# Patient Record
Sex: Male | Born: 1975 | Hispanic: Yes | Marital: Married | Smoking: Never smoker
Health system: Southern US, Community
[De-identification: ages and names within clinical notes are randomized; demographics above are authoritative.]

## PROBLEM LIST (undated history)

## (undated) DIAGNOSIS — I10 Essential (primary) hypertension: Secondary | ICD-10-CM

## (undated) HISTORY — PX: EYE SURGERY: SHX253

---

## 2016-12-25 ENCOUNTER — Ambulatory Visit (INDEPENDENT_AMBULATORY_CARE_PROVIDER_SITE_OTHER): Payer: 59

## 2016-12-25 ENCOUNTER — Ambulatory Visit (HOSPITAL_COMMUNITY)
Admission: EM | Admit: 2016-12-25 | Discharge: 2016-12-25 | Disposition: A | Discharge status: Home / Self Care | Payer: 59 | Attending: Internal Medicine | Admitting: Internal Medicine

## 2016-12-25 ENCOUNTER — Encounter (HOSPITAL_COMMUNITY): Payer: Self-pay | Admitting: Emergency Medicine

## 2016-12-25 DIAGNOSIS — M722 Plantar fascial fibromatosis: Secondary | ICD-10-CM

## 2016-12-25 DIAGNOSIS — M79671 Pain in right foot: Secondary | ICD-10-CM | POA: Diagnosis not present

## 2016-12-25 DIAGNOSIS — S96911A Strain of unspecified muscle and tendon at ankle and foot level, right foot, initial encounter: Secondary | ICD-10-CM | POA: Diagnosis not present

## 2016-12-25 HISTORY — DX: Essential (primary) hypertension: I10

## 2016-12-25 NOTE — ED Provider Notes (Signed)
[facility]    CSN: [id number] Arrival date & time: 12/25/16  1001     History   Chief Complaint Chief Complaint  Patient presents with  . Foot Pain    HPI Joseph Carey is a 41 y.o. male.   2 days ago this 41 year old male well pain to the right foot primarily involving the plantar aspect and the lateral aspect with much tenderness and pain with weightbearing. He states the pain is much better with nonweightbearing, he is using crutches. At nighttime after working all day his foot does hurt when he goes to bed. Does not remember any known trauma. Has been getting worse over the past 2 days and his job which requires being on his feet for several hours during the day makes it worse particularly toward the afternoon.      Past Medical History:  Diagnosis Date  . Hypertension     There are no active problems to display for this patient.   Past Surgical History:  Procedure Laterality Date  . EYE SURGERY         Home Medications    Prior to Admission medications   Medication Sig Start Date End Date Taking? Authorizing Provider  ibuprofen (ADVIL,MOTRIN) 800 MG tablet Take 800 mg by mouth every 8 (eight) hours as needed.   Yes [provider]  OVER THE COUNTER MEDICATION Blood pressure medicine   Yes [provider]    Family History No family history on file.  Social History Social History  Substance Use Topics  . Smoking status: Never Smoker  . Smokeless tobacco: Not on file  . Alcohol use Yes     Allergies   Patient has no known allergies.   Review of Systems Review of Systems  Constitutional: Positive for activity change.  Respiratory: Negative.   Gastrointestinal: Negative.   Genitourinary: Negative.   Musculoskeletal: Positive for gait problem. Negative for joint swelling.       As per HPI  Neurological: Negative for dizziness, weakness, numbness and headaches.  All other systems reviewed and are  negative.    Physical Exam Triage Vital Signs ED Triage Vitals [12/25/16 1026]  Enc Vitals Group     BP      Pulse      Resp      Temp      Temp src      SpO2      Weight      Height      Head Circumference      Peak Flow      Pain Score 6     Pain Loc      Pain Edu?      Excl. in GC?    No data found.   Updated Vital Signs BP 120/84 (BP Location: Left Arm)   Pulse 64   Temp 97.9 F (36.6 C) (Oral)   Resp 18   SpO2 97%   Visual Acuity Right Eye Distance:   Left Eye Distance:   Bilateral Distance:    Right Eye Near:   Left Eye Near:    Bilateral Near:     Physical Exam  Constitutional: He is oriented to person, place, and time. He appears well-developed and well-nourished. No distress.  HENT:  Head: Normocephalic and atraumatic.  Eyes: EOM are normal. Left eye exhibits no discharge.  Neck: Normal range of motion. Neck supple.  Musculoskeletal: Normal range of motion. He exhibits tenderness. He exhibits no edema or deformity.  Foot  architecture appears to be intact. No swelling or deformity. No tenderness to the dorsal SPECT of the foot or ankle. Range of motion of the ankle is intact. No tenderness to the Achilles tendon or heel. The tenderness occurs primarily to the plantar aspect of the foot along the fifth metatarsal along the midline of the foot. No significant tenderness to the plantar aspect medial from the midline. No interruption in his can integrity. No signs of infection.  Neurological: He is alert and oriented to person, place, and time. No cranial nerve deficit.  Skin: Skin is warm and dry.  Psychiatric: He has a normal mood and affect.  Nursing note and vitals reviewed.    UC Treatments / Results  Labs (all labs ordered are listed, but only abnormal results are displayed) Labs Reviewed - No data to display  EKG  EKG Interpretation None       Radiology Dg Foot Complete Right  Result Date: 12/25/2016 CLINICAL DATA:  Pain EXAM: RIGHT  FOOT COMPLETE - 3+ VIEW COMPARISON:  None. FINDINGS: Frontal, oblique, and lateral views were obtained. No fracture or dislocation. 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 Other joint spaces appear normal. No erosive change. 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 Other joint spaces appear normal. No fracture or dislocation. Electronically Signed   By: Bretta BangWilliam  Woodruff III M.D.   On: 12/25/2016 11:02    Procedures Procedures (including critical care time)  Medications Ordered in UC Medications - No data to display   Initial Impression / Assessment and Plan / UC Course  I have reviewed the triage vital signs and the nursing notes.  Pertinent labs & imaging results that were available during my care of the patient were reviewed by me and considered in my medical decision making (see chart for details).     The pain in your foot is likely due to either a condition called plantar fasciitis where the membrane under the skin becomes inflamed and is worse with weightbearing. The other condition that may exist is the way you are walking in the work boot. It may be causing you to put more weight on the side of your foot causing strain of the muscles between the bones. Recommend wearing a full-length shoe insert with high arch support and rub the bottom of your foot over a very cold can or place it over ice to 3 times a day if possible to help with inflammation. Follow-up with the podiatrist if not getting better.   Final Clinical Impressions(s) / UC Diagnoses   Final diagnoses:  Plantar fasciitis of right foot  Right foot strain, initial encounter    New Prescriptions New Prescriptions   No medications on file      Controlled Substance Prescriptions    Hayden RasmussenMabe, Jerline Linzy, NP 12/25/16 1126

## 2016-12-25 NOTE — Discharge Instructions (Signed)
The pain in your foot is likely due to either a condition called plantar fasciitis where the membrane under the skin becomes inflamed and is worse with weightbearing. The other condition that may exist is the way you are walking in the work boot. It may be causing you to put more weight on the side of your foot causing strain of the muscles between the bones. Recommend wearing a full-length shoe insert with high arch support and rub the bottom of your foot over a very cold can or place it over ice to 3 times a day if possible to help with inflammation. Follow-up with the podiatrist if not getting better.

## 2016-12-25 NOTE — ED Triage Notes (Addendum)
Monday evening started having foot pain, right  Foot pain has increased since onset. Points to bottom, center of foot as location of pain, no known injury  Right pedal pulses 2+, able to move toes

## 2019-02-12 IMAGING — DX DG FOOT COMPLETE 3+V*R*
3 series · 3 of 3 positions shown · non-contrast
Comparison: None.

CLINICAL DATA: Pain

EXAM:
RIGHT FOOT COMPLETE - 3+ VIEW

[foot ap]
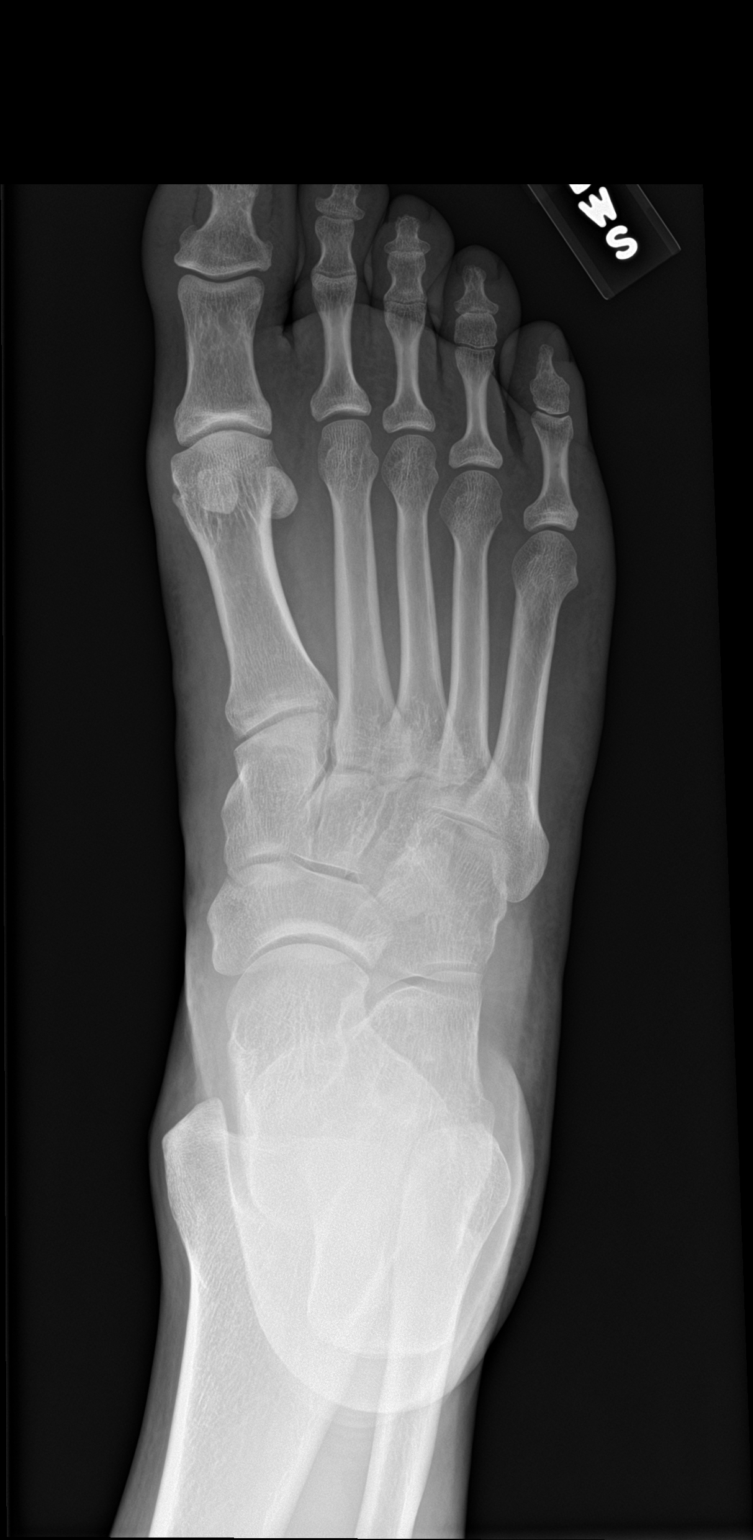

[foot obl]
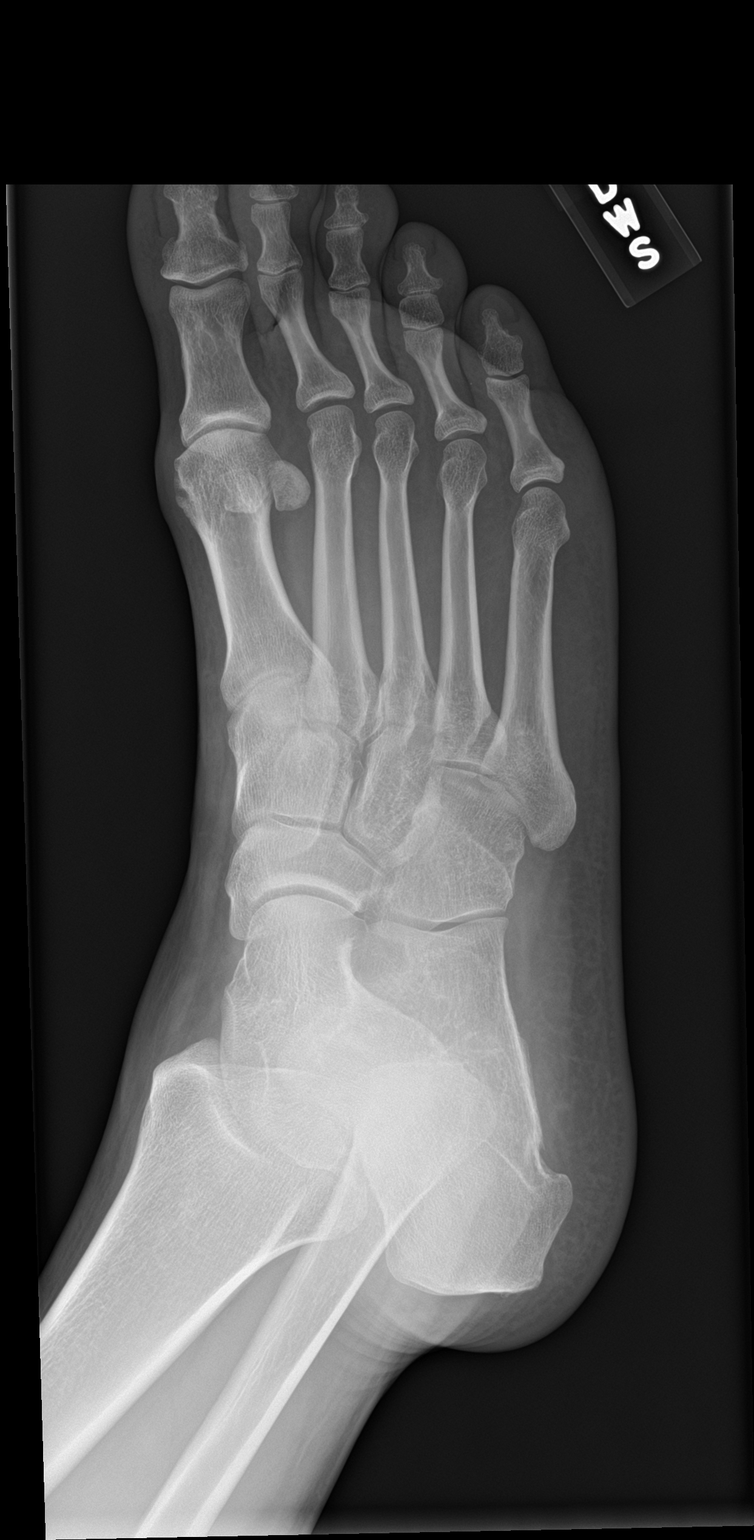

[foot lat]
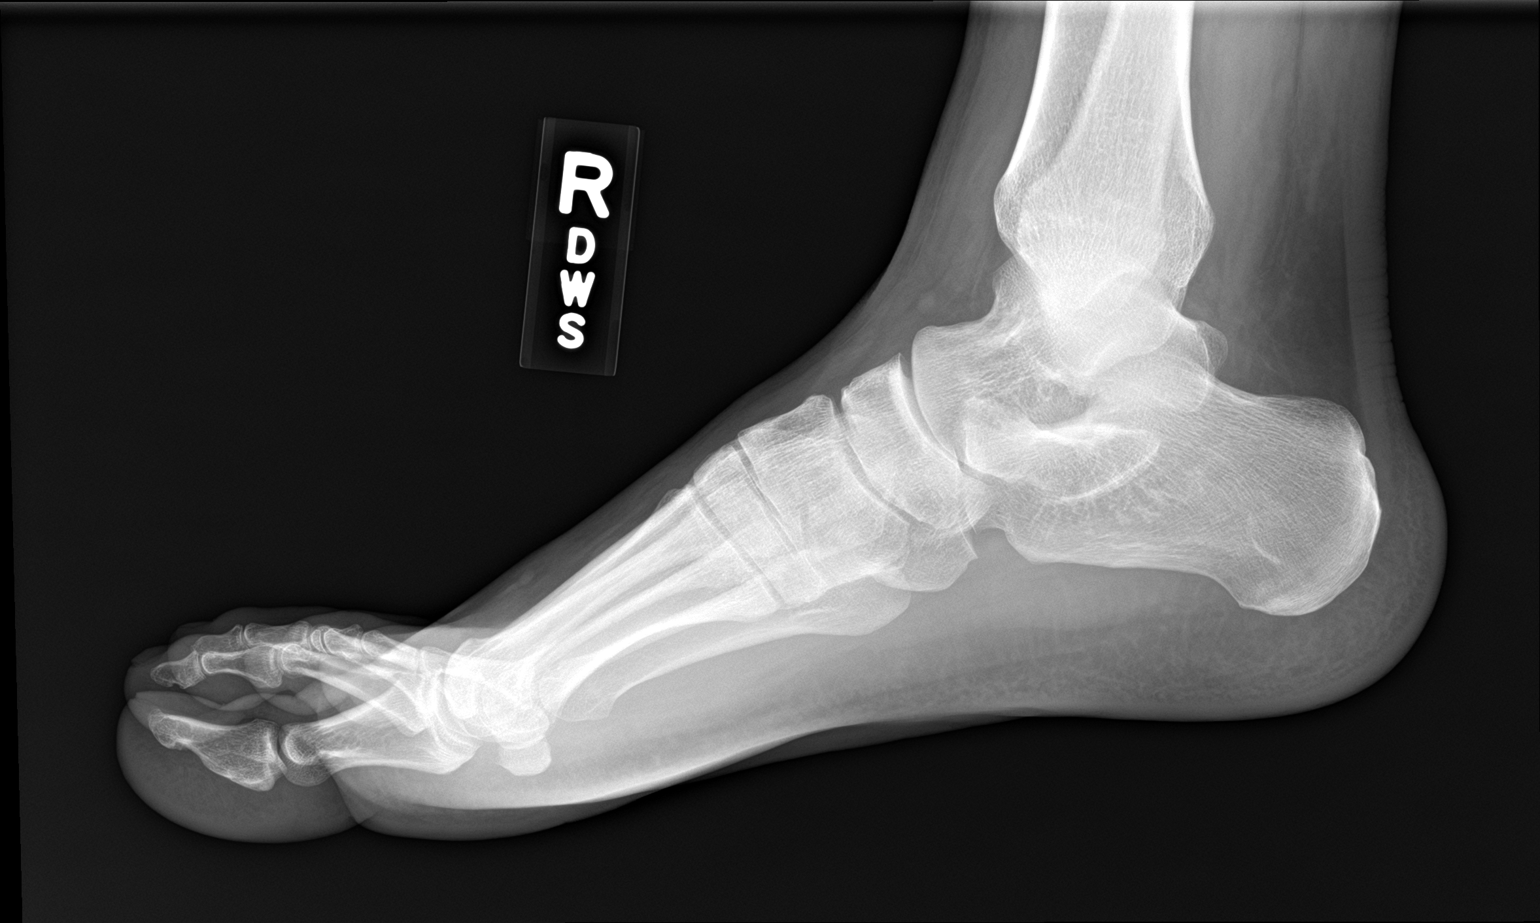

[3 of 3 positions shown; findings below may reference images not displayed]

FINDINGS: Frontal, oblique, and lateral views were obtained. No fracture or
dislocation. There is mild osteoarthritic change the first MTP
joint. Other joint spaces appear normal. No erosive change.
IMPRESSION: Mild osteoarthritic change in the first MTP joint. Other joint
spaces appear normal. No fracture or dislocation.
